# Patient Record
Sex: Male | Born: 1960 | Race: White | Hispanic: No | Marital: Single | Smoking: Never smoker
Health system: Southern US, Community
[De-identification: ages and names within clinical notes are randomized; demographics above are authoritative.]

## PROBLEM LIST (undated history)

## (undated) HISTORY — PX: KNEE SURGERY: SHX244

## (undated) HISTORY — PX: KNEE ARTHROSCOPY: SHX127

---

## 2000-05-08 ENCOUNTER — Ambulatory Visit (HOSPITAL_BASED_OUTPATIENT_CLINIC_OR_DEPARTMENT_OTHER): Admission: RE | Admit: 2000-05-08 | Discharge: 2000-05-08 | Payer: Self-pay | Admitting: General Practice

## 2010-01-28 ENCOUNTER — Emergency Department (HOSPITAL_COMMUNITY): Admission: EM | Admit: 2010-01-28 | Discharge: 2010-01-28 | Payer: Self-pay | Admitting: Emergency Medicine

## 2010-11-28 ENCOUNTER — Emergency Department (HOSPITAL_COMMUNITY): Admission: EM | Admit: 2010-11-28 | Payer: Self-pay | Source: Home / Self Care

## 2013-11-28 ENCOUNTER — Ambulatory Visit (INDEPENDENT_AMBULATORY_CARE_PROVIDER_SITE_OTHER): Payer: Self-pay | Admitting: Family Medicine

## 2013-11-28 VITALS — BP 129/83 | HR 86 | Temp 98.6°F

## 2013-11-28 DIAGNOSIS — T148XXA Other injury of unspecified body region, initial encounter: Secondary | ICD-10-CM

## 2013-11-28 DIAGNOSIS — IMO0002 Reserved for concepts with insufficient information to code with codable children: Secondary | ICD-10-CM

## 2013-11-28 DIAGNOSIS — Z23 Encounter for immunization: Secondary | ICD-10-CM

## 2013-12-07 ENCOUNTER — Encounter: Payer: Self-pay | Admitting: Physician Assistant

## 2013-12-07 ENCOUNTER — Ambulatory Visit (INDEPENDENT_AMBULATORY_CARE_PROVIDER_SITE_OTHER): Payer: Self-pay | Admitting: Physician Assistant

## 2013-12-07 VITALS — BP 120/82 | HR 75 | Temp 98.4°F | Ht 71.0 in | Wt 186.6 lb

## 2013-12-07 DIAGNOSIS — Z4802 Encounter for removal of sutures: Secondary | ICD-10-CM

## 2013-12-07 NOTE — Patient Instructions (Signed)
Wound Care Wound care helps prevent pain and infection.  You may need a tetanus shot if:  You cannot remember when you had your last tetanus shot.  You have never had a tetanus shot.  The injury broke your skin. If you need a tetanus shot and you choose not to have one, you may get tetanus. Sickness from tetanus can be serious. HOME CARE   Only take medicine as told by your doctor.  Clean the wound daily with mild soap and water.  Change any bandages (dressings) as told by your doctor.  Put medicated cream and a bandage on the wound as told by your doctor.  Change the bandage if it gets wet, dirty, or starts to smell.  Take showers. Do not take baths, swim, or do anything that puts your wound under water.  Rest and raise (elevate) the wound until the pain and puffiness (swelling) are better.  Keep all doctor visits as told. GET HELP RIGHT AWAY IF:   Yellowish-white fluid (pus) comes from the wound.  Medicine does not lessen your pain.  There is a red streak going away from the wound.  You have a fever. MAKE SURE YOU:   Understand these instructions.  Will watch your condition.  Will get help right away if you are not doing well or get worse. Document Released: 03/17/2008 Document Revised: 08/31/2011 Document Reviewed: 10/12/2010 [employer] Patient Information 2015 [employer]. This information is not intended to replace advice given to you by your health care Chaunice Obie. Make sure you discuss any questions you have with your health care Maude Hettich.  

## 2013-12-07 NOTE — Progress Notes (Signed)
Subjective:     Patient ID: Billy Cooper Isip, male   DOB: 06/30/1960, 53 y.o.   MRN: [id number]  HPI Pt here for suture removal Sutures placed ~ 10 days ago  Review of Systems Denies pain or drainage from the site    Objective:   Physical Exam Wound healing well No surrounding erythema, edema, induration or drainage FROM of the knee Sutures removed today    Assessment:     Suture removal    Plan:     Wound care reviewed Keep area clean and dry No prolonged soaking for ~ 1 more week F/U prn

## 2013-12-12 NOTE — Progress Notes (Signed)
   Subjective:    Patient ID: Billy Cooper, male    DOB: 09/01/1960, 53 y.o.   MRN: [id number]  HPI C/o laceration to left knee from chainsaw.  He was using chain saw and slipped and it his his knee and He has blood soaked through pants and is taken straight back to trauma room and jeans cut off And patient is anxious c/o left knee pain.   Review of Systems C/o left knee laceration   No chest pain, SOB, HA, dizziness, vision change, N/V, diarrhea, constipation, dysuria, urinary urgency or frequency, myalgias, arthralgias or rash.  Objective:   Physical Exam  Vital signs noted  Well developed well nourished anxious male.  HEENT - Head atraumatic Normocephalic Respiratory - Lungs CTA bilateral Cardiac - RRR S1 and S2 without murmur Skin - Left knee laceration approximately 8cm with no involvement of the patella tendon. MS - FROM left knee and no laceration of patellar tendon.  Procedure - Left knee laceration is vigorously irrigated with NS and cleansed with betadine. Laceration edges anesthetized with 1% lidocaine and then sutured with 4.0 nylon #4 sutures which Approximate laceration edges well.  Patient is given tetanus shot.      Assessment & Plan:  Laceration TD given, #4 sutures, norco 5mg  one po qid prn #30 Follow up in one week for suture removal.  Deatra CanterWilliam J Oxford FNP

## 2016-01-17 ENCOUNTER — Encounter (HOSPITAL_COMMUNITY): Payer: Self-pay | Admitting: Emergency Medicine

## 2016-01-17 DIAGNOSIS — S40012A Contusion of left shoulder, initial encounter: Secondary | ICD-10-CM | POA: Diagnosis not present

## 2016-01-17 DIAGNOSIS — S299XXA Unspecified injury of thorax, initial encounter: Secondary | ICD-10-CM | POA: Diagnosis present

## 2016-01-17 DIAGNOSIS — S20212A Contusion of left front wall of thorax, initial encounter: Secondary | ICD-10-CM | POA: Diagnosis not present

## 2016-01-17 DIAGNOSIS — Y92410 Unspecified street and highway as the place of occurrence of the external cause: Secondary | ICD-10-CM | POA: Diagnosis not present

## 2016-01-17 DIAGNOSIS — Y999 Unspecified external cause status: Secondary | ICD-10-CM | POA: Insufficient documentation

## 2016-01-17 DIAGNOSIS — Y939 Activity, unspecified: Secondary | ICD-10-CM | POA: Diagnosis not present

## 2016-01-17 DIAGNOSIS — S5001XA Contusion of right elbow, initial encounter: Secondary | ICD-10-CM | POA: Insufficient documentation
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
## 2016-01-18 ENCOUNTER — Emergency Department (HOSPITAL_COMMUNITY): Payer: Worker's Compensation

## 2016-01-18 ENCOUNTER — Emergency Department (HOSPITAL_COMMUNITY)
Admission: EM | Admit: 2016-01-18 | Discharge: 2016-01-18 | Disposition: A | Discharge status: Home / Self Care | Payer: Worker's Compensation | Attending: Emergency Medicine | Admitting: Emergency Medicine

## 2016-01-18 DIAGNOSIS — S40012A Contusion of left shoulder, initial encounter: Secondary | ICD-10-CM

## 2016-01-18 DIAGNOSIS — S20212A Contusion of left front wall of thorax, initial encounter: Secondary | ICD-10-CM

## 2016-01-18 DIAGNOSIS — S5001XA Contusion of right elbow, initial encounter: Secondary | ICD-10-CM

## 2016-01-18 MED ORDER — OXYCODONE-ACETAMINOPHEN 5-325 MG PO TABS
1.0000 | ORAL_TABLET | Freq: Once | ORAL | Status: AC
  Administered 2016-01-18: 1 via ORAL
  Filled 2016-01-18: qty 1

## 2016-01-18 MED ORDER — OXYCODONE-ACETAMINOPHEN 5-325 MG PO TABS: 1.0000 | ORAL_TABLET | ORAL | 0 refills | Status: AC | PRN

## 2016-01-18 NOTE — ED Notes (Signed)
Patient transported to X-ray 

## 2016-01-18 NOTE — ED Notes (Signed)
MD at bedside. 
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
## 2016-01-18 NOTE — Discharge Instructions (Signed)
Take acetaminophen or ibuprofen for less severe pain. °
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
## 2016-01-18 NOTE — ED Notes (Signed)
Pt walked to door and states he can not sit there any longer and will be leaving; Md notified; Pt was asked to give Md time to discharge him; Pt states he will wait for a few minutes
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
